# Patient Record
Sex: Male | Born: 2005 | Race: White | Hispanic: No | Marital: Single | Smoking: Never smoker
Health system: Southern US, Community
[De-identification: ages and names within clinical notes are randomized; demographics above are authoritative.]

---
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
## 2024-08-28 LAB — URINALYSIS, ROUTINE W REFLEX MICROSCOPIC
Bilirubin Urine: NEGATIVE
Glucose, UA: NEGATIVE mg/dL
Hgb urine dipstick: NEGATIVE
Ketones, ur: NEGATIVE mg/dL
Leukocytes,Ua: NEGATIVE
Nitrite: NEGATIVE
Protein, ur: NEGATIVE mg/dL
Specific Gravity, Urine: 1.018 (ref 1.005–1.030)
pH: 6.5 (ref 5.0–8.0)

## 2024-08-28 LAB — COMPREHENSIVE METABOLIC PANEL WITH GFR
ALT: 22 U/L (ref 0–44)
AST: 21 U/L (ref 15–41)
Albumin: 4.7 g/dL (ref 3.5–5.0)
Alkaline Phosphatase: 101 U/L (ref 38–126)
Anion gap: 13 (ref 5–15)
BUN: 10 mg/dL (ref 6–20)
CO2: 26 mmol/L (ref 22–32)
Calcium: 10.2 mg/dL (ref 8.9–10.3)
Chloride: 100 mmol/L (ref 98–111)
Creatinine, Ser: 0.9 mg/dL (ref 0.61–1.24)
GFR, Estimated: >60 mL/min (ref >60)
Glucose, Bld: 124 mg/dL — ABNORMAL HIGH (ref 70–99)
Potassium: 3.7 mmol/L (ref 3.5–5.1)
Sodium: 139 mmol/L (ref 135–145)
Total Bilirubin: 0.5 mg/dL (ref 0.0–1.2)
Total Protein: 8.2 g/dL — ABNORMAL HIGH (ref 6.5–8.1)

## 2024-08-28 LAB — CBC
HCT: 45.8 % (ref 39.0–52.0)
Hemoglobin: 16.1 g/dL (ref 13.0–17.0)
MCH: 30.5 pg (ref 26.0–34.0)
MCHC: 35.2 g/dL (ref 30.0–36.0)
MCV: 86.7 fL (ref 80.0–100.0)
Platelets: 378 K/uL (ref 150–400)
RBC: 5.28 MIL/uL (ref 4.22–5.81)
RDW: 12.2 % (ref 11.5–15.5)
WBC: 8.3 K/uL (ref 4.0–10.5)
nRBC: 0 % (ref 0.0–0.2)

## 2024-08-28 LAB — LIPASE, BLOOD: Lipase: 23 U/L (ref 11–51)

## 2024-08-28 MED ADMIN — Naproxen Tab 250 MG: 500 mg | ORAL | @ 17:00:00 | NDC 50268059411

## 2024-08-28 MED FILL — Naproxen Tab 250 MG: 500.0000 mg | ORAL | Qty: 2 | Status: AC

## 2024-08-29 ENCOUNTER — Ambulatory Visit (HOSPITAL_BASED_OUTPATIENT_CLINIC_OR_DEPARTMENT_OTHER)
Admission: RE | Admit: 2024-08-29 | Discharge: 2024-08-29 | Disposition: A | Discharge status: Home / Self Care | Payer: Self-pay | Source: Ambulatory Visit | Attending: Emergency Medicine | Admitting: Emergency Medicine

## 2024-08-29 ENCOUNTER — Encounter (HOSPITAL_BASED_OUTPATIENT_CLINIC_OR_DEPARTMENT_OTHER): Payer: Self-pay

## 2024-08-29 DIAGNOSIS — N433 Hydrocele, unspecified: Secondary | ICD-10-CM | POA: Insufficient documentation

## 2024-08-29 DIAGNOSIS — N50811 Right testicular pain: Secondary | ICD-10-CM | POA: Insufficient documentation

## 2024-08-29 NOTE — ED Provider Notes (Signed)
 Presented for ultrasound of scrotum with Doppler and ultrasound renal study.  See results below.  Results were discussed with patient.  Symptoms have improved.  Did give her urology contacted for information if symptoms were to return.  Given ER return precautions.    Narrative & Impression  CLINICAL DATA:  Right testicular pain 2 days.   EXAM: SCROTAL ULTRASOUND   DOPPLER ULTRASOUND OF THE TESTICLES   TECHNIQUE: Complete ultrasound examination of the testicles, epididymis, and other scrotal structures was performed. Color and spectral Doppler ultrasound were also utilized to evaluate blood flow to the testicles.   COMPARISON:  None Available.   FINDINGS: Right testicle   Measurements: 4.4 x 2.1 x 3.3 cm. No mass or microlithiasis visualized.   Doppler: There is normal vascularity on color doppler examination. Spectral doppler arterial and venous waveforms are normal.   Left testicle   Measurements:  4.2 x 2.2 x 2.7 cm. No mass or microlithiasis visualized. Doppler: There is normal vascularity on color doppler examination. Spectral doppler arterial and venous waveforms are normal.   Right epididymis:  2 mm epididymal head cyst versus spermatocele.   Left epididymis:  Normal in size and appearance.   Hydrocele:  Small right hydrocele.   Varicocele:  None visualized.   IMPRESSION: 1. Normal testicles. 2. Small right hydrocele. 3. 2 mm right epididymal head cyst versus spermatocele.   EXAM: US  Retroperitoneum Complete, Renal. 08/29/2024 06:04:03 PM   TECHNIQUE: Real-time ultrasonography of the retroperitoneum renal was performed.   COMPARISON: None available   CLINICAL HISTORY: Eval for hydronephrosis/stone, right-sided pain.   FINDINGS:   RIGHT KIDNEY/URETER: Right kidney measures 12.0 x 5.5 x 4.3 cm. Volume is 147.612 cm. Renal cortical thickness and echogenicity are within normal limits. No hydronephrosis. No intrarenal calcification. No intrarenal  mass.   LEFT KIDNEY/URETER: Left kidney measures 10.4 x 4.9 x 5.1 cm. Volume is 134.4192 cm. Renal cortical thickness and echogenicity are within normal limits. No hydronephrosis. No intrarenal calcification. No intrarenal mass.   BLADDER: Unremarkable appearance of the bladder.   IMPRESSION: 1. No hydronephrosis or renal calculus identified.   Shermon Warren SAILOR, PA-C 08/29/24 1826    Charlyn Sora, MD 08/29/24 [phone]
